# Patient Record
Sex: Male | Born: 1995 | Race: White | Hispanic: No | Marital: Single | Smoking: Never smoker
Health system: Southern US, Community
[De-identification: ages and names within clinical notes are randomized; demographics above are authoritative.]

## PROBLEM LIST (undated history)

## (undated) DIAGNOSIS — I370 Nonrheumatic pulmonary valve stenosis: Secondary | ICD-10-CM

## (undated) DIAGNOSIS — K5530 Necrotizing enterocolitis, unspecified: Secondary | ICD-10-CM

## (undated) DIAGNOSIS — J45909 Unspecified asthma, uncomplicated: Secondary | ICD-10-CM

## (undated) DIAGNOSIS — K219 Gastro-esophageal reflux disease without esophagitis: Secondary | ICD-10-CM

## (undated) HISTORY — PX: TYMPANOSTOMY: SHX2586

## (undated) HISTORY — PX: CARDIAC SURGERY: SHX584

---
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
## 2014-03-12 ENCOUNTER — Other Ambulatory Visit: Payer: Self-pay | Admitting: *Deleted

## 2014-03-12 MED ORDER — MELOXICAM 15 MG PO TABS: 15.0000 mg | ORAL_TABLET | Freq: Every day | ORAL | Status: DC

## 2014-03-12 NOTE — Telephone Encounter (Signed)
[facility] sent refill req for meloxicam 15 mg #30 3 refills one by mouth daily. Approved by dr Al Corpushyatt.

## 2015-01-16 ENCOUNTER — Other Ambulatory Visit: Payer: Self-pay | Admitting: *Deleted

## 2015-01-16 MED ORDER — MELOXICAM 15 MG PO TABS: 15.0000 mg | ORAL_TABLET | Freq: Every day | ORAL | Status: AC

## 2015-01-16 NOTE — Telephone Encounter (Signed)
Refill request from pharmacy for mobic, ok to refill

## 2015-06-20 ENCOUNTER — Encounter: Payer: Self-pay | Admitting: Podiatry

## 2015-06-20 ENCOUNTER — Ambulatory Visit (INDEPENDENT_AMBULATORY_CARE_PROVIDER_SITE_OTHER): Payer: Managed Care, Other (non HMO) | Admitting: Podiatry

## 2015-06-20 VITALS — BP 130/74 | HR 76 | Resp 16

## 2015-06-20 DIAGNOSIS — L600 Ingrowing nail: Secondary | ICD-10-CM | POA: Diagnosis not present

## 2015-06-20 MED ORDER — NEOMYCIN-POLYMYXIN-HC 1 % OT SOLN: OTIC | Status: AC

## 2015-06-20 MED ORDER — NEOMYCIN-POLYMYXIN-HC 1 % OT SOLN: OTIC | Status: DC

## 2015-06-20 NOTE — Patient Instructions (Signed)

## 2015-06-20 NOTE — Progress Notes (Signed)
   Subjective:    Patient ID: Alec Moore, male    DOB: [birth year], 19 y.o.   MRN: [id number]  HPI Comments: "I have ingrown toenails"  Patient c/o tender 1st toes bilateral, right over left, for several months, off and on. He states currently the 1st right, lateral border, is red and swollen. He has been soaking and clipping the nail.  Toe Pain       Review of Systems  Skin: Positive for wound.  All other systems reviewed and are negative.      Objective:   Physical Exam I have reviewed his past medical history medications allergy surgery social history and review of systems. Pulses are strongly palpable. Neurologic sensorium is intact per Semmes-Weinstein monofilament. Deep tendon reflexes are intact bilateral muscle strength +5 over 5 dorsiflexion plantar flexors and inverters everters all intrinsic musculature is intact. Orthopedic evaluation demonstrates all joints distal to the ankle range of motion without crepitation. 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  Cutaneous evaluation demonstrates supple well-hydrated cutis sharply incurvated nail margins of the the tibial and fibular borders of the hallux. No cellulitis purulence or odor.        Assessment & Plan:  Assessment: Ingrown nail hallux bilateral  Plan: Chemical matrixectomy was performed today after local anesthesia was achieved. Patient tolerated procedure well as the nail was split from distal to proximal avulsed margins exposed the matrix of the nailbed to the phenol. Phenol was applied 30 seconds each 3 and was neutralized with isopropyl all all. Patient will start soaking it twice a day in Betadine warm water and apply Cortisporin Otic as directed. These instructions were provided and reviewed. I will follow up with the patient in 1 week.

## 2015-06-20 NOTE — Addendum Note (Signed)
Addended by: Lottie Rater E on: 06/20/2015 11:15 AM   Modules accepted: Orders

## 2015-06-23 ENCOUNTER — Telehealth: Payer: Self-pay | Admitting: *Deleted

## 2015-06-23 NOTE — Telephone Encounter (Signed)
Pt's mtr states pt's medication was not called into the [facility] in [place] [address].  I reviewed pt's medication orders and the Cortisporin had been escribed and confirmed received 06/20/2015 @ 1115am.  I informed pt's mtr.

## 2015-07-01 ENCOUNTER — Encounter: Payer: Self-pay | Admitting: Podiatry

## 2015-07-01 ENCOUNTER — Ambulatory Visit (INDEPENDENT_AMBULATORY_CARE_PROVIDER_SITE_OTHER): Payer: Managed Care, Other (non HMO) | Admitting: Podiatry

## 2015-07-01 DIAGNOSIS — L600 Ingrowing nail: Secondary | ICD-10-CM

## 2015-07-01 NOTE — Patient Instructions (Signed)

## 2015-07-01 NOTE — Progress Notes (Addendum)
He presents today for follow-up of matrixectomy tibial and fibular borders of the hallux bilateral. He states that he continues to soak in Betadine warm water and apply Cortisporin otic twice daily and cover with Band-Aids. He denies fever chills nausea vomiting muscle aches and pains.  Objective: Vital signs are stable he is alert and oriented 3. Pulses are palpable. Nail plates and margins appear to be healing very well. There are no signs of infection at this point.  Assessment: Well-healed surgical matrixectomy hallux bilateral.  Plan: Discontinue Betadine in warm water start with Epsom salts and water twice daily. Continue the use of Cortisporin otic. Cardura in the day and leave open at nighttime. Continue soaks until completely resolved and finally with questions or concerns.    Jaidev Sanger Maud Deed DPM

## 2024-07-18 ENCOUNTER — Ambulatory Visit (INDEPENDENT_AMBULATORY_CARE_PROVIDER_SITE_OTHER)
Admission: RE | Admit: 2024-07-18 | Discharge: 2024-07-18 | Disposition: A | Discharge status: Home / Self Care | Source: Ambulatory Visit | Attending: Family Medicine | Admitting: Family Medicine

## 2024-07-18 ENCOUNTER — Other Ambulatory Visit (HOSPITAL_BASED_OUTPATIENT_CLINIC_OR_DEPARTMENT_OTHER): Payer: Self-pay | Admitting: Family Medicine

## 2024-07-18 DIAGNOSIS — R0789 Other chest pain: Secondary | ICD-10-CM

## 2024-07-19 ENCOUNTER — Encounter (HOSPITAL_BASED_OUTPATIENT_CLINIC_OR_DEPARTMENT_OTHER): Payer: Self-pay | Admitting: Family Medicine

## 2024-07-19 DIAGNOSIS — R0789 Other chest pain: Secondary | ICD-10-CM
